# Patient Record
Sex: Female | Born: 1945 | Race: Black or African American | Hispanic: No | Marital: Married | Smoking: Current every day smoker
Health system: Southern US, Community
[De-identification: ages and names within clinical notes are randomized; demographics above are authoritative.]

---

## 2007-01-20 ENCOUNTER — Ambulatory Visit (HOSPITAL_COMMUNITY): Admission: RE | Admit: 2007-01-20 | Discharge: 2007-01-22 | Payer: Self-pay | Admitting: Neurosurgery

## 2008-11-14 IMAGING — CR DG CHEST 2V
2 series · 2 of 2 positions shown · non-contrast
Comparison: none

CLINICAL DATA: 61-year-old female, cervical herniated disk with myelopathy, history of smoking.  Preop evaluation for anterior cervical fusion.
CHEST ? 2 VIEW:

[view not recorded (1 of 2)]
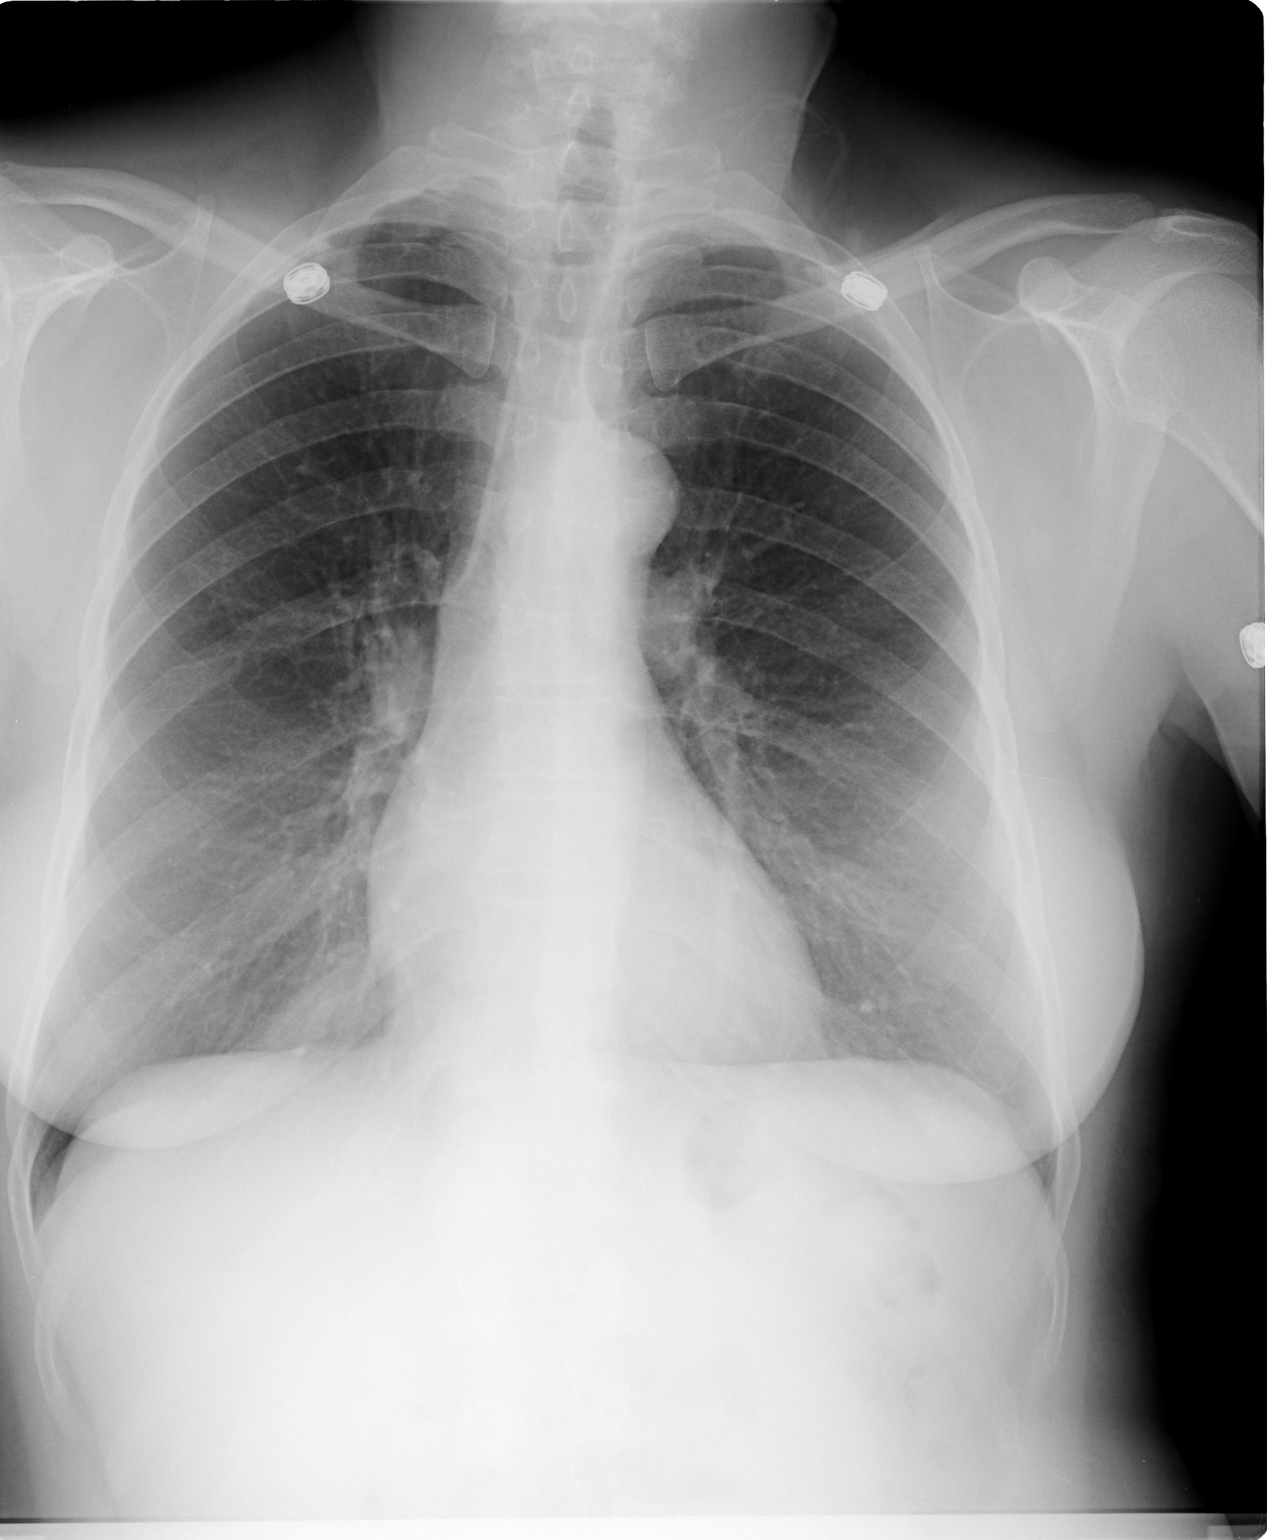

[view not recorded (2 of 2)]
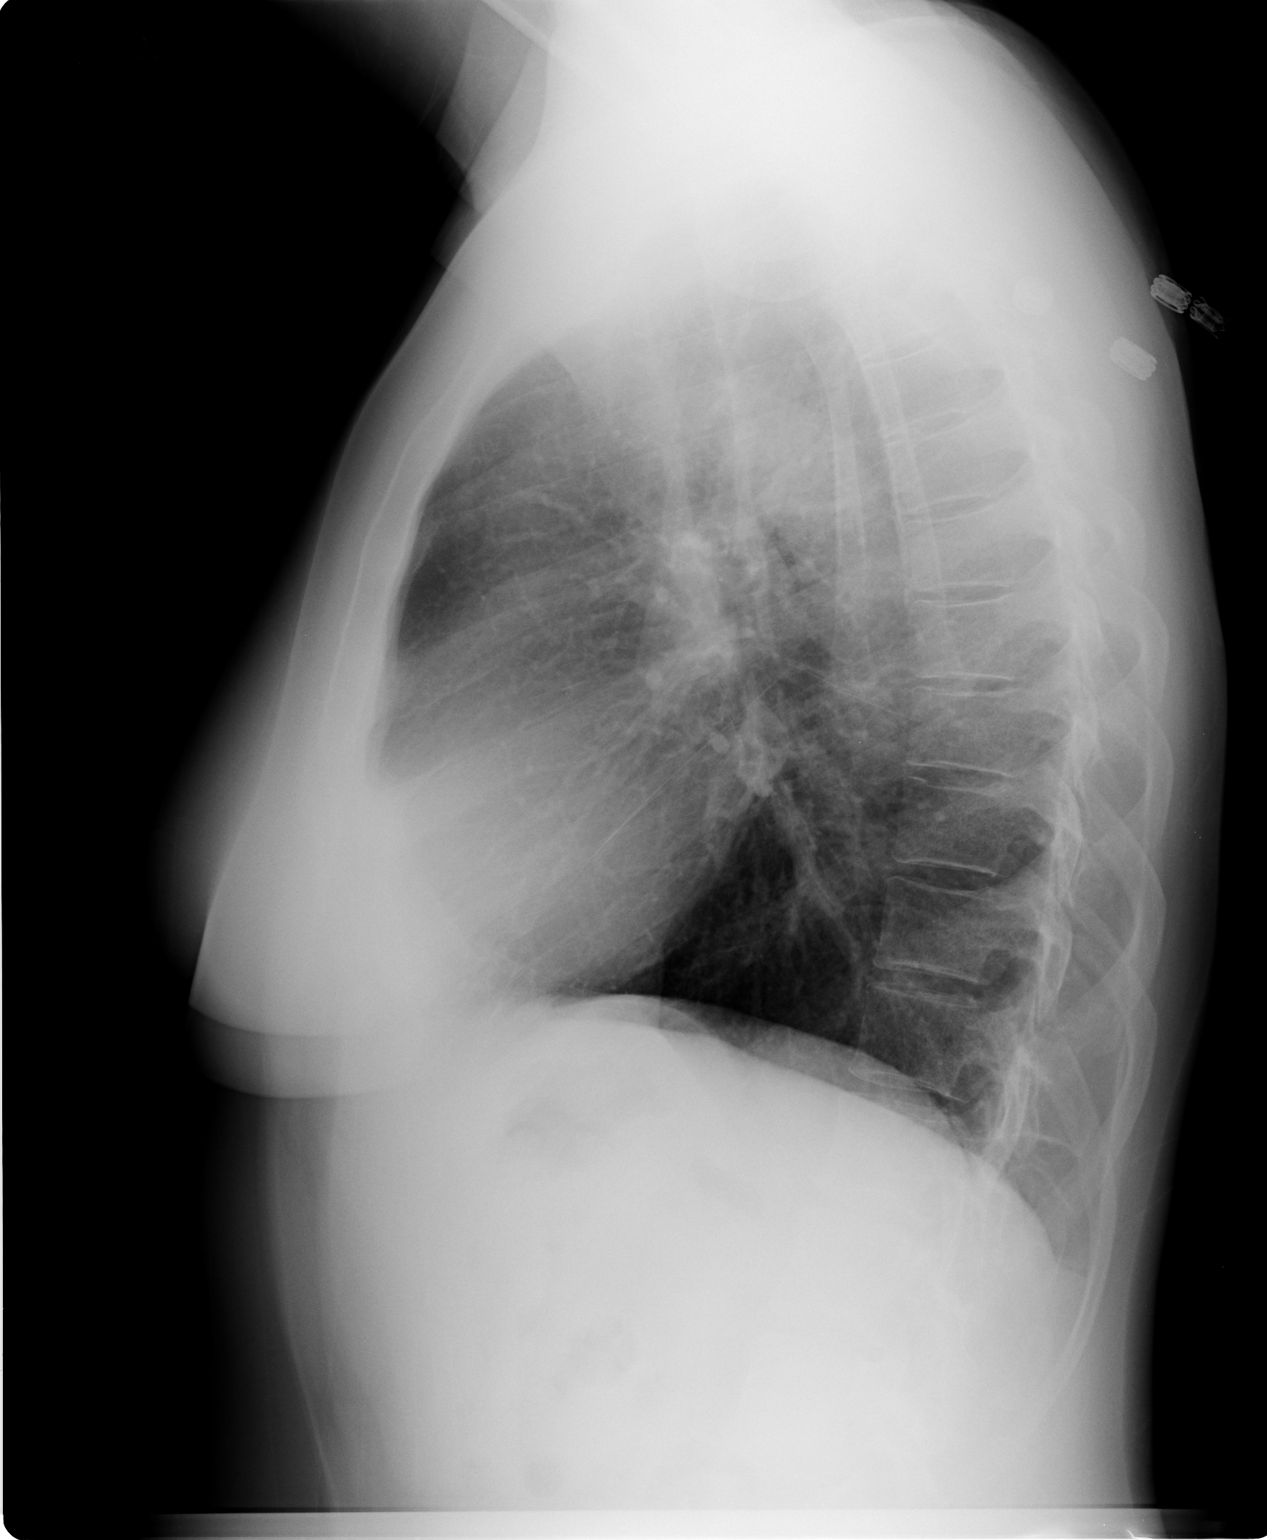

[2 of 2 positions shown; findings below may reference images not displayed]

FINDINGS: No acute airspace process, edema, effusion, or pneumothorax.  Normal heart size.  Punctate calcified 3-4 granuloma noted in the left lower lobe.
IMPRESSION: 1.  No acute chest process.  
2.  Small left lower lobe calcified granuloma.

## 2008-11-17 IMAGING — CR DG CERVICAL SPINE 2 OR 3 VIEWS
1 series · 1 of 1 positions shown · non-contrast
Comparison: none

CLINICAL DATA: C4-5, C5-6 ACDF. 
 PORTABLE LATERAL CERVICAL SPINE - 2 VIEW:

[view not recorded]
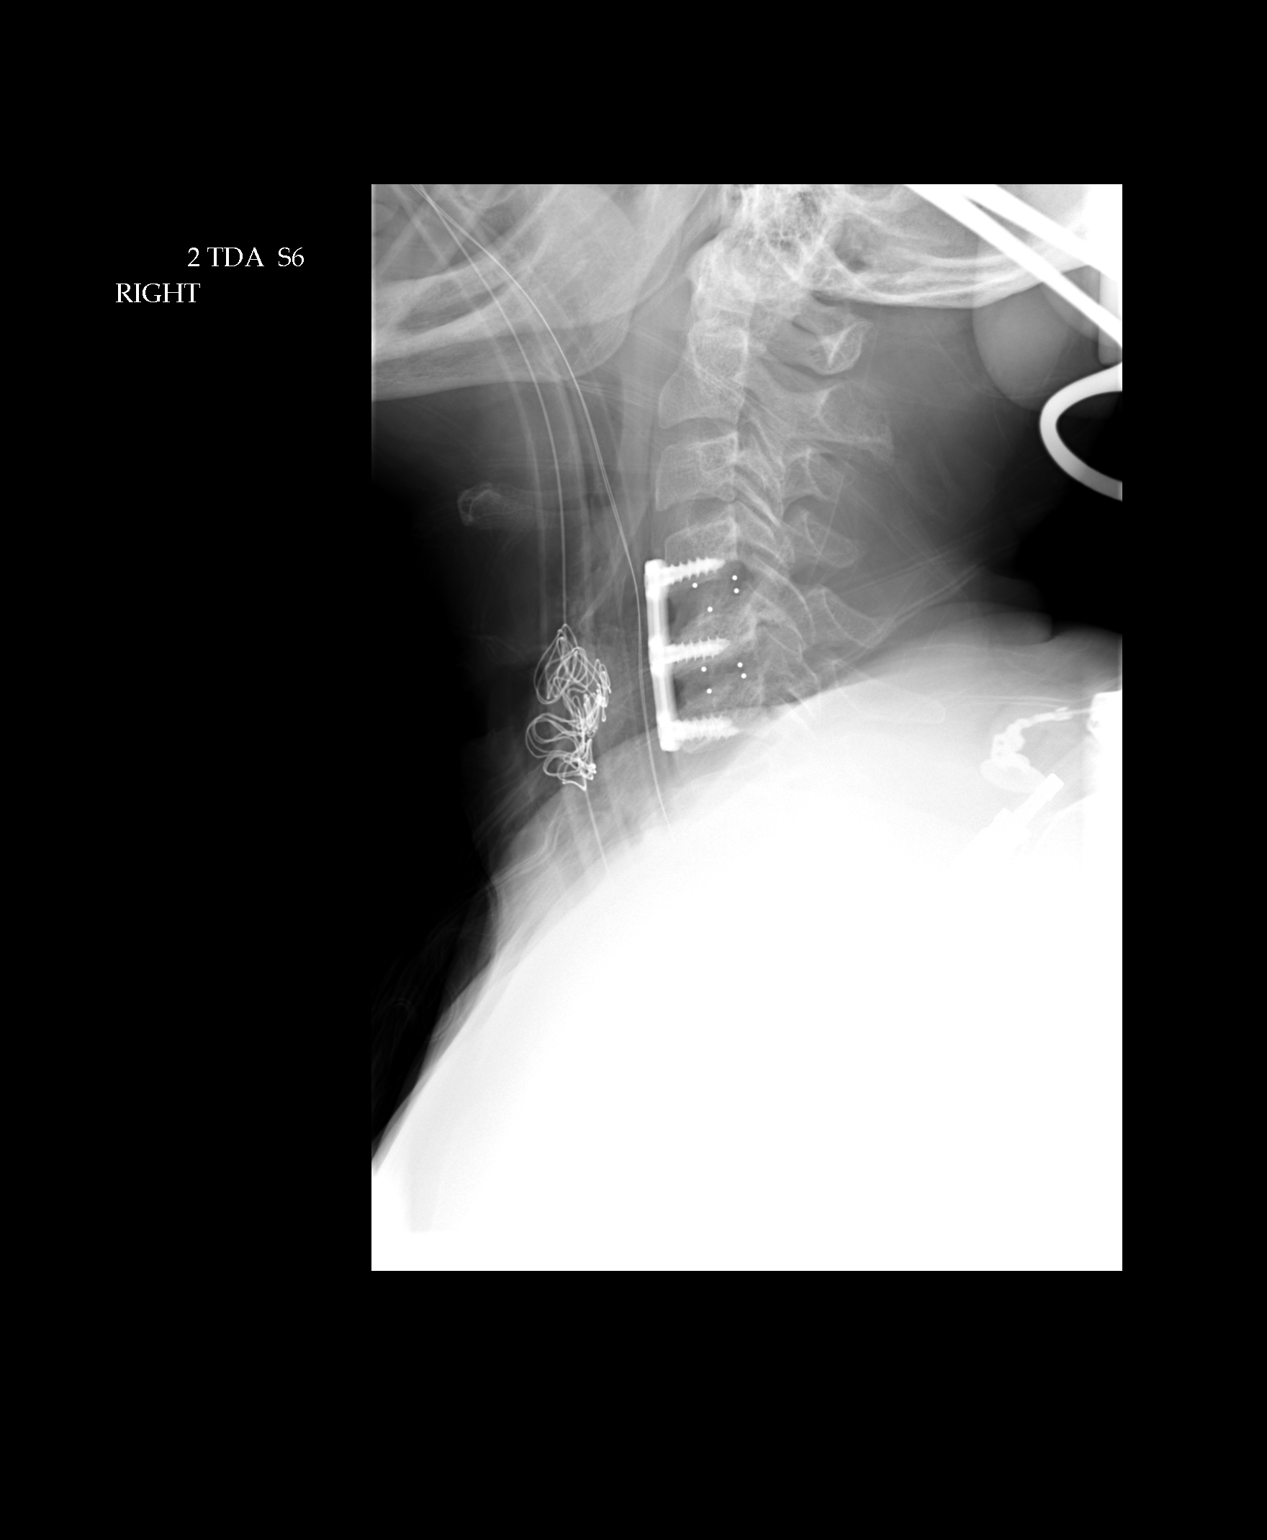

[1 of 1 positions shown; findings below may reference images not displayed]

FINDINGS: Initial film shows a needle marking the anterior disk space of C4-5.   
 A second film shows anterior cervical diskectomy and fusion from C4 to C6.  Interbody fusion material is in place.  There is an anterior plate with screws. Sponges are in place along the operative approach.
IMPRESSION: As discussed above.

## 2010-12-02 NOTE — Op Note (Signed)
NAMEYAMILET, MCFAYDEN              ACCOUNT NO.:  [id number]   MEDICAL RECORD NO.:  [id number]          PATIENT TYPE:  INP   LOCATION:  NA                           FACILITY:  [facility]   PHYSICIAN:  Danae Orleans. Venetia Maxon, M.D.  DATE OF BIRTH:  07-25-1945   DATE OF PROCEDURE:  01/20/2007  DATE OF DISCHARGE:                               OPERATIVE REPORT   PREOPERATIVE DIAGNOSIS:  Herniated cervical disk with spondylosis,  degenerative disease, kyphosis and cervical radiculopathy C4-5, C5-6  level.   POSTOPERATIVE DIAGNOSIS:  Herniated cervical disk with spondylosis,  degenerative disease, kyphosis and cervical radiculopathy C4-5, C5-6  level.   PROCEDURE:  Anterior cervical decompression and fusion, C4-5 and C5-6  with PEEK interbody cages, morcellized bone autograft, demineralized  bone matrix and anterior cervical plate.   SURGEON:  Danae Orleans. Venetia Maxon, M.D.   ASSISTANTMichail Jewels, RN and Jeral Fruit, MD   ANESTHESIA:  General endotracheal anesthesia.   ESTIMATED BLOOD LOSS:  Minimal.   COMPLICATIONS:  None.   DISPOSITION:  Recovery.   INDICATIONS:  Kylan Veach is a 65 year old woman with a severe right  C5 radiculopathy with a large herniated cervical disk at C4-5 to the  right along with severe spondylosis at C5-6 and kyphotic deformity at C4-  5.  It was elected take her surgery for anterior cervical decompression  fusion at C4-5 and C5-6 levels.   PROCEDURE:  Ms. Celia is brought to the operating room.  Following  satisfactory uncomplicated induction of general endotracheal anesthesia  plus intravenous lines, the patient was placed in supine position on the  operating table.  Her neck was placed in neutral alignment to slight  extension on horseshoe head holder in 5 pounds halter traction.  Her  anterior neck was then prepped and draped in the usual sterile fashion.  Area of planned incision was infiltrated with 0.25% Marcaine and 0.5%  lidocaine with 1:200,000 epinephrine.   Incision was made from the  midline to the anterior border of the sternocleidomastoid muscle left  side of midline cut sharply through the platysma layer.  Subplatysmal  dissection was performed exposing the anterior border  sternocleidomastoid muscle, using blunt dissection the carotid sheath  was kept lateral, trachea and esophagus kept medial exposing the  anterior cervical spine.  A bent spinal needle was placed at what was  felt to be the C4-5 level and this was confirmed on intraoperative x-  ray.  Subsequently the longus colli muscles were taken down from the  anterior cervical spine C4-C6 levels bilaterally using electrocautery  and Key elevator and self-retaining shadow line retractor was placed  along with up and down retractor.  Distraction pins were used to  facilitate distraction initially at C4-5.  Distraction pins were placed  C4 and C5 and gentle distraction after the interspace was incised, the  disk material was removed in piecemeal fashion.  Endplates were stripped  of residual disk material.  The high-speed drill was used with  aspirating trap and the endplates were decorticated and the bone removed  with the decortication was saved for later use with bone grafting.  Microscope  was brought into field.  Under microscopic visualization the  endplate that the posterior longitudinal ligament was incised and  removed in piecemeal fashion and multiple large fragments of herniated  disk material removed to the right side of midline with resultant  significant decompression the right C5 nerve root. Both the spinal cord  dura and both the C5 nerve roots were decompressed.  Hemostasis was  assured with Gelfoam soaked thrombin and attention was then turned to  the C5-6 level where similar decompression was performed.  Large ventral  osteophytes were removed with Leksell rongeur.  Endplates were stripped  of residual disk material.  Distraction pins were placed at C6 and   endplates were stripped of residual disk material and then using high-  speed drill the endplates were decorticated and bone autograft was again  saved for later use bone grafting and the posterior longitudinal  ligament was incised and removed in piecemeal fashion and this spinal  cord dura was decompressed as were both C6 nerve roots as they extended  out the neural foramina.  Hemostasis was again assured with Gelfoam  soaked in thrombin.  After trial sizing a 5-mm medium PEEK interbody  cage was selected, packed with morcellized bone autograft and  demineralized bone matrix inserted in the interspace and countersunk  appropriately.  Attention then turned C4-5 level where a 6 mm spacer was  placed and traction weight was removed.  A 30 mm Trestle anterior  cervical plate was then affixed to the anterior cervical spine using 12-  mm variable angle screws, two at C4, two at C5, two at C6 all screws had  excellent purchase and locking mechanisms were engaged.  Positioning of  plate and interbody graft was confirmed on lateral C-spine radiograph.  The wound was then irrigated bacitracin saline.  Soft tissues were  inspected and found to be good repair.  Platysma layer was closed with 3-  0 Vicryl sutures and skin edges were approximated with interrupted 3-0  Vicryl subcu stitch.  The wound was dressed with Dermabond.  The patient  extubated in the operating room taken to recovery in stable satisfactory  having tolerated operation well.  Counts correct at end the case.      Danae Orleans. Venetia Maxon, M.D.  Electronically Signed     JDS/MEDQ  D:  01/20/2007  T:  01/21/2007  Job:  [id number]

## 2011-05-06 LAB — CBC
Hemoglobin: 12.9
RBC: 4.19
WBC: 10.2

## 2011-05-06 LAB — BASIC METABOLIC PANEL
CO2: 28
Calcium: 9.7
GFR calc Af Amer: >60
GFR calc non Af Amer: >60
Sodium: 140

## 2022-01-14 ENCOUNTER — Ambulatory Visit: Payer: Medicare HMO | Admitting: Orthopaedic Surgery

## 2022-01-14 ENCOUNTER — Ambulatory Visit (INDEPENDENT_AMBULATORY_CARE_PROVIDER_SITE_OTHER): Payer: Medicare HMO

## 2022-01-14 ENCOUNTER — Encounter: Payer: Self-pay | Admitting: Orthopaedic Surgery

## 2022-01-14 VITALS — Ht 67.0 in | Wt 182.0 lb

## 2022-01-14 DIAGNOSIS — M25571 Pain in right ankle and joints of right foot: Secondary | ICD-10-CM | POA: Diagnosis not present

## 2022-01-14 NOTE — Progress Notes (Signed)
Office Visit Note   Patient: Nancy Rosales           Date of Birth: 1945-10-18           MRN: [id number] Visit Date: 01/14/2022              Requested by: No referring provider defined for this encounter. PCP: Patient, No Pcp Per  Chief Complaint  Patient presents with   Right Foot - Pain, Injury, Edema    Fell on a brick and hurt foot. Swollen some and hurts real bad. Soaked it but that didn't help. Not taking anything for pain, had an back injection and that doctor told her to not take anything because it could counteract with the injection medicine.      HPI: Patient is a pleasant 76 year old woman who is 5 days status post falling through a broken brick and twisting her ankle.  She denies any previous history of problems with this foot or ankle.  She tried soaking it but it did not help she is wearing a sandal today.  She denies any instability.  Most of her pain is focused on the lateral side of her ankle.  Assessment & Plan: Visit Diagnoses:  1. Pain in right ankle and joints of right foot     Plan: X-rays today did not demonstrate any fractures or problems through the mortise in the ankle or the Lisfranc complex.  Most likely soft tissue ankle sprain as most of her pain is laterally.  We will immobilize her in a short cam walker boot.  She can come out of this to do alphabet exercises.  We will reevaluate in 3 weeks if she is no better  Follow-Up Instructions: 3 weeks if no improvement  Ortho Exam  Patient is alert, oriented, no adenopathy, well-dressed, normal affect, normal respiratory effort. Examination of her right ankle she does have some mild to moderate soft tissue swelling.  No ecchymosis noted on the plantar surface of her foot dorsum of her foot or her ankle.  She is able to plantarflex dorsiflex invert and evert.  Mildly tender over the ankle joint mostly tender over the lateral ligament complex.  No tenderness with manipulation of her foot through the  Lisfranc complex.  Pulses are palpable.  Foot is warm no redness or cellulitis sensation is intact  Imaging: XR Ankle Complete Right  Result Date: 01/14/2022 X-rays of her right ankle were taken today in multiple projections.  She has well-maintained alignment through the mortise no acute fractures of the distal fibula tibia and talus were noted.  XR Foot 2 Views Right  Result Date: 01/14/2022 Radiographs of her right foot were obtained today in multiple projections.  She has well-maintained alignment minimal degenerative changes no acute fractures no widening of the Lisfranc complex  No images are attached to the encounter.  Labs: No results found for: "HGBA1C", "ESRSEDRATE", "CRP", "LABURIC", "REPTSTATUS", "GRAMSTAIN", "CULT", "LABORGA"   No results found for: "ALBUMIN", "PREALBUMIN", "CBC"  No results found for: "MG" No results found for: "VD25OH"  No results found for: "PREALBUMIN"    01/17/2007    1:30 PM  CBC EXTENDED  WBC 10.2   RBC 4.19   Hemoglobin 12.9   HCT 38.7   Platelets 346      Body mass index is 28.51 kg/m.  Orders:  Orders Placed This Encounter  Procedures   XR Ankle Complete Right   XR Foot 2 Views Right   No orders of the defined  types were placed in this encounter.    Procedures: No procedures performed  Clinical Data: No additional findings.  ROS:  All other systems negative, except as noted in the HPI. Review of Systems  Objective: Vital Signs: Ht 5\' 7"  (1.702 m)   Wt 182 lb (82.6 kg)   BMI 28.51 kg/m   Specialty Comments:  No specialty comments available.  PMFS History: There are no problems to display for this patient.  No past medical history on file.  No family history on file.   Social History   Occupational History   Not on file  Tobacco Use   Smoking status: Every Day    Types: Cigarettes    Passive exposure: Never   Smokeless tobacco: Never  Substance and Sexual Activity   Alcohol use: Not on file   Drug  use: Not on file   Sexual activity: Not on file

## 2022-01-29 ENCOUNTER — Ambulatory Visit (INDEPENDENT_AMBULATORY_CARE_PROVIDER_SITE_OTHER): Payer: Medicare HMO | Admitting: Orthopaedic Surgery

## 2022-01-29 ENCOUNTER — Ambulatory Visit: Payer: Medicare HMO | Admitting: Orthopaedic Surgery

## 2022-01-29 DIAGNOSIS — M25571 Pain in right ankle and joints of right foot: Secondary | ICD-10-CM

## 2022-01-29 NOTE — Progress Notes (Signed)
   Office Visit Note   Patient: Nancy Rosales           Date of Birth: [birth year]           MRN: [id number] Visit Date: 01/29/2022              Requested by: No referring provider defined for this encounter. PCP: Patient, No Pcp Per   Assessment & Plan: Visit Diagnoses:  1. Pain in right ankle and joints of right foot     Plan: Patient should continue to improve she will return if she has ongoing problems.  Follow-Up Instructions: Return if symptoms worsen or fail to improve.   Orders:  No orders of the defined types were placed in this encounter.  No orders of the defined types were placed in this encounter.     Procedures: No procedures performed   Clinical Data: No additional findings.   Subjective: Chief Complaint  Patient presents with   Right Ankle - Follow-up    HPI follow-up right ankle sprain when she was at a funeral and I stepped on a brick that was sitting by itself on the pavement which actually was broken and she rolled her ankle.  Black cam boot is more cumbersome for her than the compressive slide on sleeve that she bought at the store.  States it is gradually getting better but she is still amatory with a limp.  No past history of ankle injury x-rays of foot and ankle were negative for fracture  Review of Systems updated unchanged   Objective: Vital Signs: There were no vitals taken for this visit.  Physical Exam Constitutional:      Appearance: She is well-developed.  HENT:     Head: Normocephalic.     Right Ear: External ear normal.     Left Ear: External ear normal. There is no impacted cerumen.  Eyes:     Pupils: Pupils are equal, round, and reactive to light.  Neck:     Thyroid: No thyromegaly.     Trachea: No tracheal deviation.  Cardiovascular:     Rate and Rhythm: Normal rate.  Pulmonary:     Effort: Pulmonary effort is normal.  Abdominal:     Palpations: Abdomen is soft.  Musculoskeletal:     Cervical back: No rigidity.   Skin:    General: Skin is warm and dry.  Neurological:     Mental Status: She is alert and oriented to person, place, and time.  Psychiatric:        Behavior: Behavior normal.     Ortho Exam right ankle anterolateral swelling negative anterior drawer some tenderness over the peroneals she is amatory with a pronounced limp.  Specialty Comments:  No specialty comments available.  Imaging: No results found.   PMFS History: Patient Active Problem List   Diagnosis Date Noted   Pain in right ankle and joints of right foot 01/14/2022   No past medical history on file.  No family history on file.  No past surgical history on file. Social History   Occupational History   Not on file  Tobacco Use   Smoking status: Every Day    Types: Cigarettes    Passive exposure: Never   Smokeless tobacco: Never  Substance and Sexual Activity   Alcohol use: Not on file   Drug use: Not on file   Sexual activity: Not on file
